# Patient Record
Sex: Male | Born: 1970 | Race: White | Hispanic: No | Marital: Single | Smoking: Current every day smoker
Health system: Southern US, Community
[De-identification: ages and names within clinical notes are randomized; demographics above are authoritative.]

## PROBLEM LIST (undated history)

## (undated) DIAGNOSIS — Z21 Asymptomatic human immunodeficiency virus [HIV] infection status: Secondary | ICD-10-CM

## (undated) DIAGNOSIS — R7989 Other specified abnormal findings of blood chemistry: Secondary | ICD-10-CM

## (undated) DIAGNOSIS — B20 Human immunodeficiency virus [HIV] disease: Secondary | ICD-10-CM

## (undated) DIAGNOSIS — E785 Hyperlipidemia, unspecified: Secondary | ICD-10-CM

## (undated) HISTORY — DX: Human immunodeficiency virus (HIV) disease: B20

## (undated) HISTORY — DX: Other specified abnormal findings of blood chemistry: R79.89

## (undated) HISTORY — PX: APPENDECTOMY: SHX54

## (undated) HISTORY — DX: Asymptomatic human immunodeficiency virus (hiv) infection status: Z21

## (undated) HISTORY — DX: Hyperlipidemia, unspecified: E78.5

---

## 2007-10-26 ENCOUNTER — Emergency Department (HOSPITAL_COMMUNITY): Admission: EM | Admit: 2007-10-26 | Discharge: 2007-10-26 | Payer: Self-pay | Admitting: Emergency Medicine

## 2007-10-29 ENCOUNTER — Emergency Department (HOSPITAL_COMMUNITY): Admission: EM | Admit: 2007-10-29 | Discharge: 2007-10-29 | Payer: Self-pay | Admitting: Emergency Medicine

## 2007-10-31 ENCOUNTER — Encounter: Admission: RE | Admit: 2007-10-31 | Discharge: 2007-10-31 | Payer: Self-pay | Admitting: Infectious Diseases

## 2007-10-31 ENCOUNTER — Ambulatory Visit: Payer: Self-pay | Admitting: Infectious Diseases

## 2007-10-31 DIAGNOSIS — B20 Human immunodeficiency virus [HIV] disease: Secondary | ICD-10-CM | POA: Insufficient documentation

## 2007-10-31 LAB — CONVERTED CEMR LAB
Albumin: 3.8 g/dL (ref 3.5–5.2)
CO2: 28 meq/L (ref 19–32)
Calcium: 8.8 mg/dL (ref 8.4–10.5)
Chlamydia, Swab/Urine, PCR: NEGATIVE
Chloride: 104 meq/L (ref 96–112)
GC Probe Amp, Urine: NEGATIVE
Glucose, Bld: 98 mg/dL (ref 70–99)
HIV 1 RNA Quant: >20000000 copies/mL — ABNORMAL HIGH (ref <50)
Hep A Total Ab: NEGATIVE
Leukocytes, UA: NEGATIVE
Lymphocytes Relative: 20 % (ref 12–46)
Lymphs Abs: 0.9 10*3/uL (ref 0.7–4.0)
Monocytes Relative: 8 % (ref 3–12)
Neutrophils Relative %: 72 % (ref 43–77)
Platelets: 126 10*3/uL — ABNORMAL LOW (ref 150–400)
Potassium: 4.3 meq/L (ref 3.5–5.3)
Protein, ur: NEGATIVE mg/dL
RBC: 4.99 M/uL (ref 4.22–5.81)
Sodium: 139 meq/L (ref 135–145)
Specific Gravity, Urine: 1.024 (ref 1.005–1.03)
Streptococcus, Group A Screen (Direct): NEGATIVE
Total Protein: 5.9 g/dL — ABNORMAL LOW (ref 6.0–8.3)
Urine Glucose: NEGATIVE mg/dL
WBC: 4.5 10*3/uL (ref 4.0–10.5)

## 2007-11-01 ENCOUNTER — Encounter: Payer: Self-pay | Admitting: Infectious Diseases

## 2007-11-01 LAB — CONVERTED CEMR LAB: HIV-1 antibody: NEGATIVE

## 2007-11-06 ENCOUNTER — Emergency Department (HOSPITAL_COMMUNITY): Admission: EM | Admit: 2007-11-06 | Discharge: 2007-11-06 | Payer: Self-pay | Admitting: Emergency Medicine

## 2007-11-08 ENCOUNTER — Telehealth: Payer: Self-pay | Admitting: Internal Medicine

## 2007-11-09 ENCOUNTER — Telehealth: Payer: Self-pay

## 2007-11-09 ENCOUNTER — Ambulatory Visit: Payer: Self-pay | Admitting: Infectious Diseases

## 2007-11-13 ENCOUNTER — Ambulatory Visit: Payer: Self-pay | Admitting: Internal Medicine

## 2007-11-17 ENCOUNTER — Encounter: Payer: Self-pay | Admitting: Infectious Diseases

## 2007-11-20 ENCOUNTER — Telehealth: Payer: Self-pay | Admitting: Infectious Diseases

## 2007-11-21 ENCOUNTER — Encounter: Payer: Self-pay | Admitting: Infectious Diseases

## 2007-11-22 ENCOUNTER — Telehealth: Payer: Self-pay | Admitting: Infectious Disease

## 2007-11-22 DIAGNOSIS — B370 Candidal stomatitis: Secondary | ICD-10-CM

## 2007-11-22 HISTORY — DX: Candidal stomatitis: B37.0

## 2007-11-23 ENCOUNTER — Ambulatory Visit: Payer: Self-pay | Admitting: Infectious Disease

## 2007-11-27 ENCOUNTER — Telehealth: Payer: Self-pay | Admitting: Internal Medicine

## 2007-12-08 ENCOUNTER — Telehealth: Payer: Self-pay | Admitting: Infectious Diseases

## 2007-12-20 ENCOUNTER — Encounter: Payer: Self-pay | Admitting: Infectious Diseases

## 2007-12-26 ENCOUNTER — Ambulatory Visit: Payer: Self-pay | Admitting: Infectious Diseases

## 2008-01-17 ENCOUNTER — Telehealth: Payer: Self-pay | Admitting: Infectious Diseases

## 2008-01-17 ENCOUNTER — Encounter: Payer: Self-pay | Admitting: Infectious Diseases

## 2008-01-18 ENCOUNTER — Encounter: Payer: Self-pay | Admitting: Infectious Diseases

## 2008-03-18 ENCOUNTER — Telehealth: Payer: Self-pay

## 2008-08-14 ENCOUNTER — Encounter: Payer: Self-pay | Admitting: Infectious Diseases

## 2008-11-06 ENCOUNTER — Encounter: Payer: Self-pay | Admitting: Infectious Diseases

## 2009-10-28 IMAGING — CT CT ABDOMEN W/O CM
2 of 4 series · 13 of 32 positions shown, 18 images · non-contrast
Comparison: There are no prior studies for comparison.

CLINICAL DATA: Back pain, nausea.  
 ABDOMEN CT WITHOUT CONTRAST ? 10/26/07:
TECHNIQUE: Multidetector CT imaging of the abdomen was performed following the standard protocol without IV contrast.
TECHNIQUE: Multidetector CT imaging of the pelvis was performed following the standard protocol without IV contrast.

[Series 2: <(id) w/o a/p >(id) · axial · non-contrast · 0.70mm/px · z∈[-467,-167]mm · 5 of 92 slices shown, 10 images]
[im 16/92  soft-tissue]
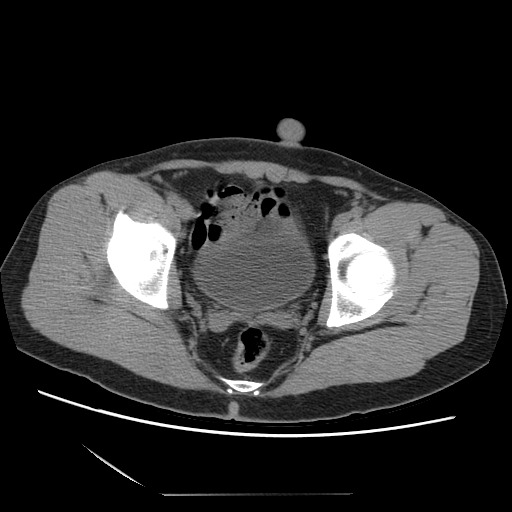
[im 16/92  bone]
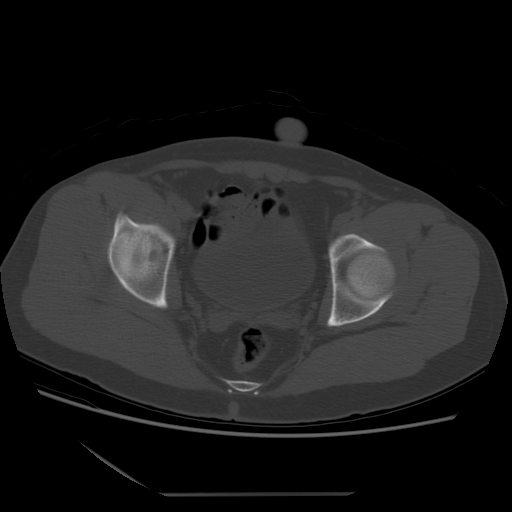
[im 31/92  soft-tissue]
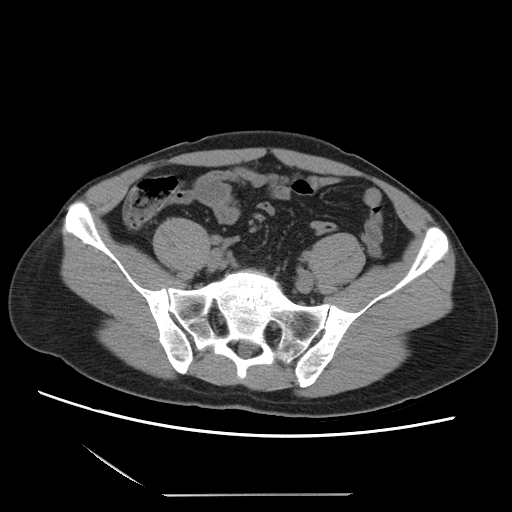
[im 31/92  lung]
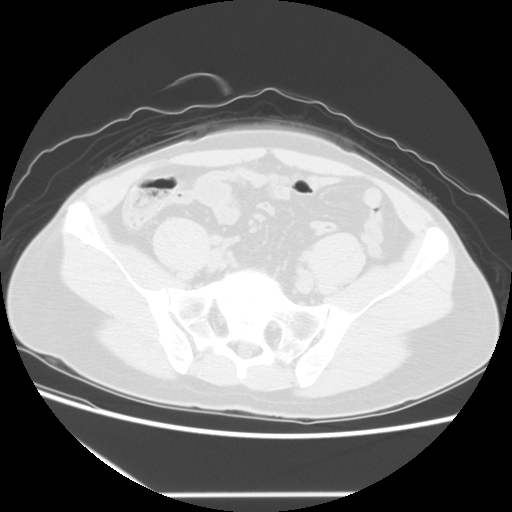
[im 46/92  soft-tissue]
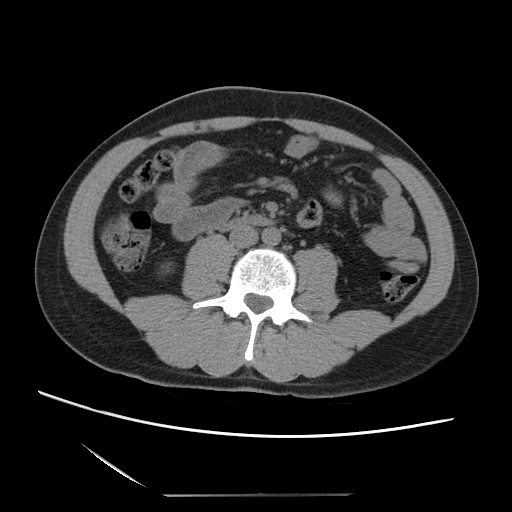
[im 46/92  lung]
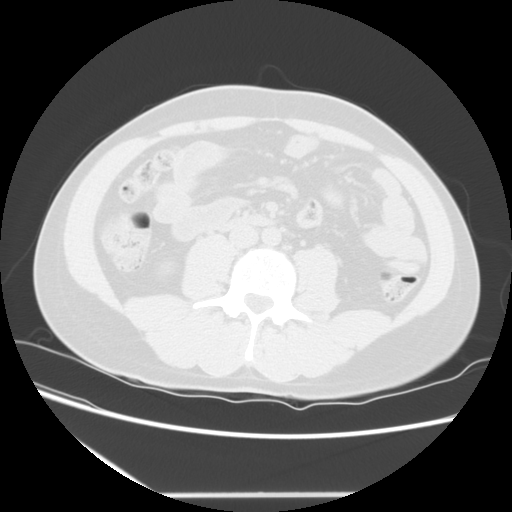
[im 61/92  soft-tissue]
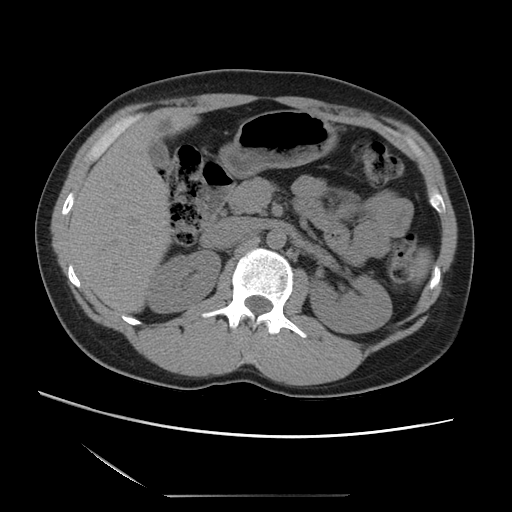
[im 61/92  lung]
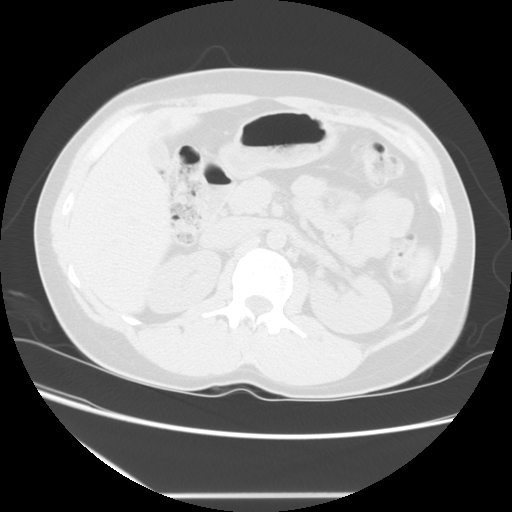
[im 76/92  soft-tissue]
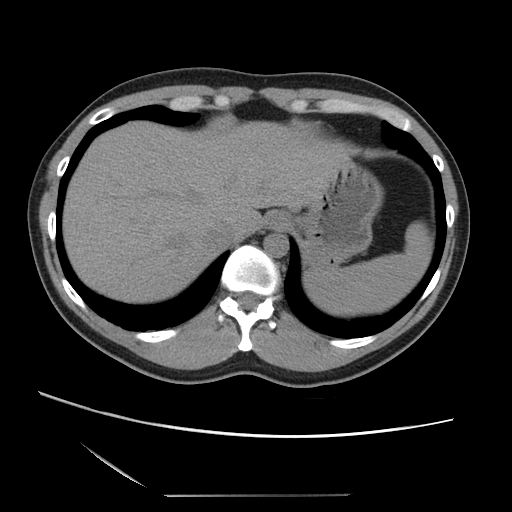
[im 76/92  lung]
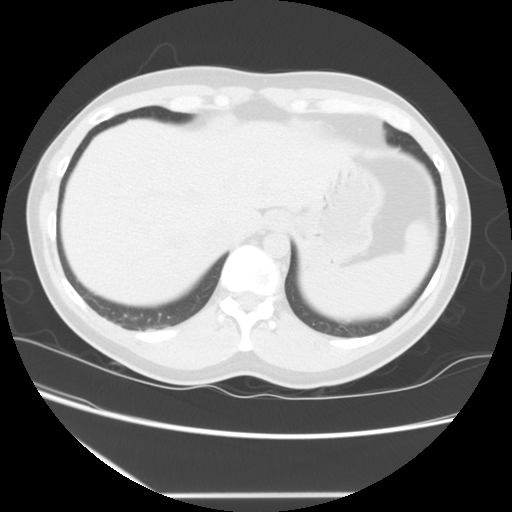

[Series 400: reformatted · sagittal · 0.92mm/px · 8 of 163 slices shown]
[im 14/163  soft-tissue]
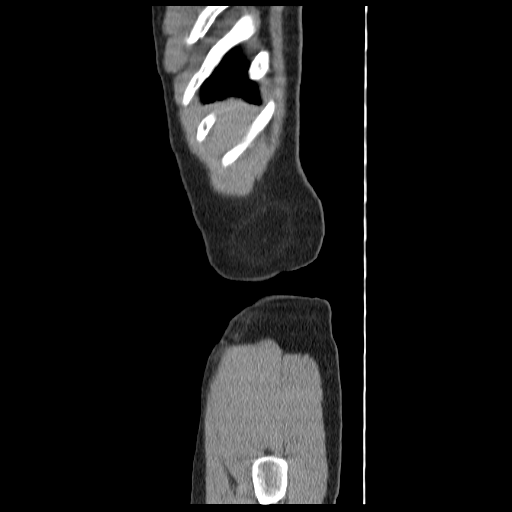
[im 41/163  soft-tissue]
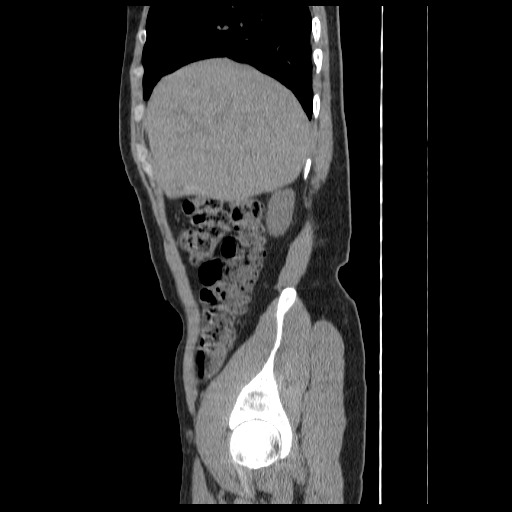
[im 55/163  soft-tissue]
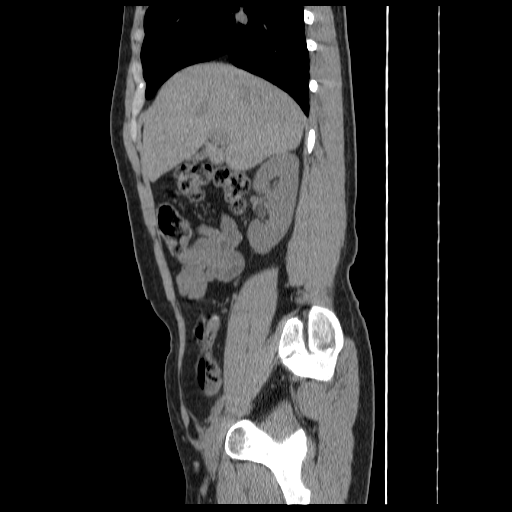
[im 68/163  soft-tissue]
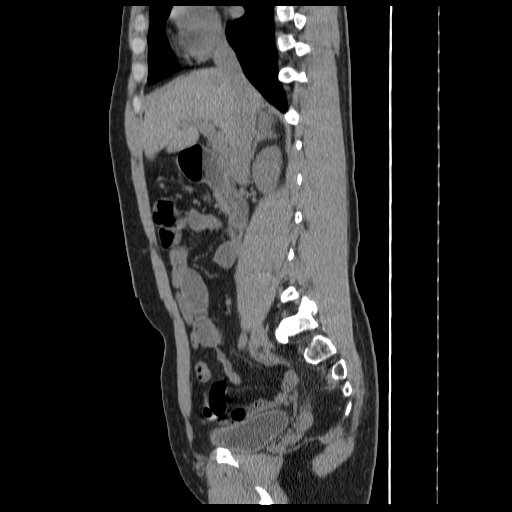
[im 95/163  soft-tissue]
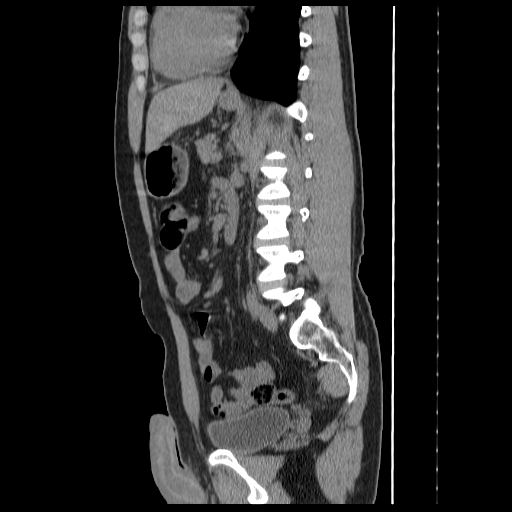
[im 109/163  soft-tissue]
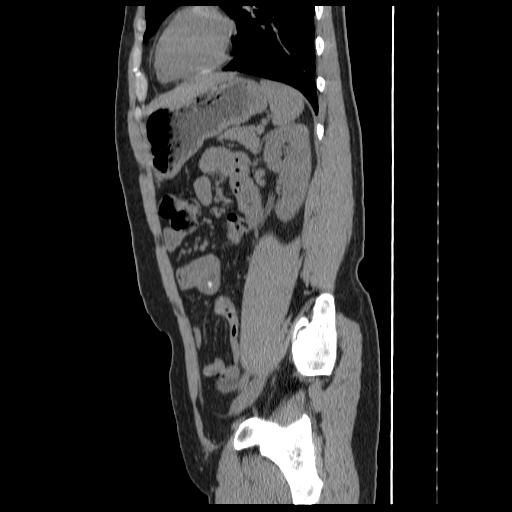
[im 122/163  soft-tissue]
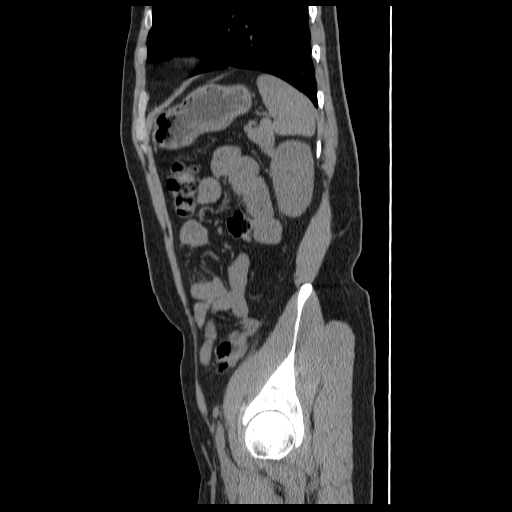
[im 149/163  soft-tissue]
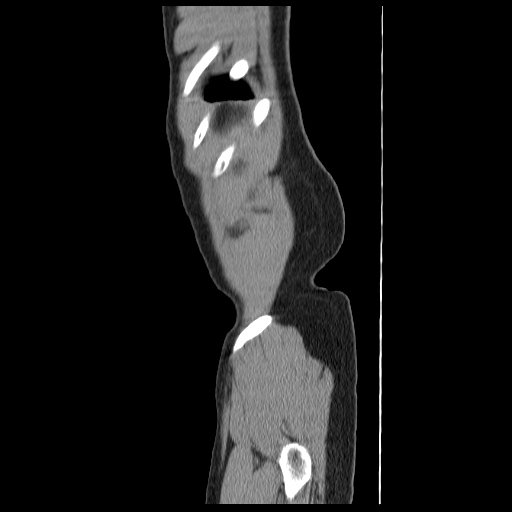

[13 of 32 positions shown; findings below may reference images not displayed]

FINDINGS: Minimal dependent subsegmental atelectasis is noted.  The gallbladder appears mildly contracted.  The noncontrast appearance of the liver, spleen, pancreas and adrenal glands is unremarkable.  No renal calculus or hydronephrosis.  No dilated upper abdominal bowel noted. 
 There is mild levoconvex lumbar scoliosis.
IMPRESSION: 1.  No acute upper abdominal findings. 
 2.  Mild levoconvex lumbar scoliosis.  
 3.  The gallbladder appears mildly contracted. 
 PELVIS CT WITHOUT CONTRAST:
FINDINGS: No distal ureteral calculus noted.   The urinary bladder appears unremarkable.  No free pelvic fluid noted.  No pathologic pelvic adenopathy is evident.
IMPRESSION: No calculus is evident.  No acute pelvic findings.  The appendix is not well seen, but no discrete right lower quadrant inflammatory process is evident.

## 2011-05-10 LAB — CBC
HCT: 39.8
Hemoglobin: 13.8
WBC: 3.2 — ABNORMAL LOW

## 2011-05-10 LAB — URINALYSIS, ROUTINE W REFLEX MICROSCOPIC
Glucose, UA: NEGATIVE
Ketones, ur: NEGATIVE
pH: 6

## 2011-05-10 LAB — DIFFERENTIAL
Basophils Absolute: 0
Basophils Relative: 0
Neutro Abs: 2.6
Neutrophils Relative %: 82 — ABNORMAL HIGH

## 2011-05-10 LAB — COMPREHENSIVE METABOLIC PANEL
Alkaline Phosphatase: 45
BUN: 10
CO2: 24
Chloride: 106
Glucose, Bld: 113 — ABNORMAL HIGH
Potassium: 4.1
Total Bilirubin: 0.6

## 2011-05-10 LAB — T-HELPER CELL (CD4) - (RCID CLINIC ONLY): CD4 % Helper T Cell: 45

## 2011-05-10 LAB — RPR: RPR Ser Ql: NONREACTIVE

## 2011-05-10 LAB — HIV ANTIBODY (ROUTINE TESTING W REFLEX): HIV: REACTIVE — AB

## 2013-07-16 DEATH — deceased

## 2019-02-22 ENCOUNTER — Ambulatory Visit: Payer: Self-pay

## 2019-02-22 ENCOUNTER — Other Ambulatory Visit: Payer: Self-pay

## 2019-02-26 ENCOUNTER — Ambulatory Visit: Payer: 59

## 2019-02-26 ENCOUNTER — Other Ambulatory Visit (HOSPITAL_COMMUNITY)
Admission: RE | Admit: 2019-02-26 | Discharge: 2019-02-26 | Disposition: A | Discharge status: Home / Self Care | Payer: 59 | Source: Ambulatory Visit | Attending: Family | Admitting: Family

## 2019-02-26 ENCOUNTER — Other Ambulatory Visit: Payer: 59

## 2019-02-26 ENCOUNTER — Other Ambulatory Visit: Payer: Self-pay

## 2019-02-26 DIAGNOSIS — B20 Human immunodeficiency virus [HIV] disease: Secondary | ICD-10-CM

## 2019-02-26 DIAGNOSIS — Z113 Encounter for screening for infections with a predominantly sexual mode of transmission: Secondary | ICD-10-CM | POA: Diagnosis present

## 2019-02-27 LAB — T-HELPER CELL (CD4) - (RCID CLINIC ONLY)
CD4 % Helper T Cell: 46 % (ref 33–65)
CD4 T Cell Abs: 1370 /uL (ref 400–1790)

## 2019-02-27 LAB — URINE CYTOLOGY ANCILLARY ONLY
Chlamydia: NEGATIVE
Neisseria Gonorrhea: NEGATIVE

## 2019-02-28 LAB — URINALYSIS
Bilirubin Urine: NEGATIVE
Glucose, UA: NEGATIVE
Hgb urine dipstick: NEGATIVE
Ketones, ur: NEGATIVE
Leukocytes,Ua: NEGATIVE
Nitrite: NEGATIVE
Protein, ur: NEGATIVE
Specific Gravity, Urine: 1.013 (ref 1.001–1.03)
pH: <=5 (ref 5.0–8.0)

## 2019-03-05 ENCOUNTER — Other Ambulatory Visit: Payer: Self-pay

## 2019-03-05 ENCOUNTER — Ambulatory Visit: Payer: Self-pay

## 2019-03-13 LAB — CBC WITH DIFFERENTIAL/PLATELET
Absolute Monocytes: 546 cells/uL (ref 200–950)
Basophils Absolute: 84 cells/uL (ref 0–200)
Basophils Relative: 0.8 %
Eosinophils Absolute: 74 cells/uL (ref 15–500)
Eosinophils Relative: 0.7 %
HCT: 44.8 % (ref 38.5–50.0)
Hemoglobin: 15.6 g/dL (ref 13.2–17.1)
Lymphs Abs: 3140 cells/uL (ref 850–3900)
MCH: 32.4 pg (ref 27.0–33.0)
MCHC: 34.8 g/dL (ref 32.0–36.0)
MCV: 92.9 fL (ref 80.0–100.0)
MPV: 9.6 fL (ref 7.5–12.5)
Monocytes Relative: 5.2 %
Neutro Abs: 6657 cells/uL (ref 1500–7800)
Neutrophils Relative %: 63.4 %
Platelets: 313 10*3/uL (ref 140–400)
RBC: 4.82 10*6/uL (ref 4.20–5.80)
RDW: 13 % (ref 11.0–15.0)
Total Lymphocyte: 29.9 %
WBC: 10.5 10*3/uL (ref 3.8–10.8)

## 2019-03-13 LAB — LIPID PANEL
Cholesterol: 269 mg/dL — ABNORMAL HIGH (ref <200)
HDL: 36 mg/dL — ABNORMAL LOW (ref >=40)
Non-HDL Cholesterol (Calc): 233 mg/dL (calc) — ABNORMAL HIGH (ref <130)
Total CHOL/HDL Ratio: 7.5 (calc) — ABNORMAL HIGH (ref <5.0)
Triglycerides: 410 mg/dL — ABNORMAL HIGH (ref <150)

## 2019-03-13 LAB — HIV-1/2 AB - DIFFERENTIATION
HIV-1 antibody: POSITIVE — AB
HIV-2 Ab: NEGATIVE

## 2019-03-13 LAB — HIV-1 RNA ULTRAQUANT REFLEX TO GENTYP+
HIV 1 RNA Quant: 35 copies/mL — ABNORMAL HIGH
HIV-1 RNA Quant, Log: 1.54 Log copies/mL — ABNORMAL HIGH

## 2019-03-13 LAB — HIV ANTIBODY (ROUTINE TESTING W REFLEX): HIV 1&2 Ab, 4th Generation: REACTIVE — AB

## 2019-03-13 LAB — COMPLETE METABOLIC PANEL WITH GFR
AG Ratio: 1.7 (calc) (ref 1.0–2.5)
ALT: 28 U/L (ref 9–46)
AST: 16 U/L (ref 10–40)
Albumin: 4.7 g/dL (ref 3.6–5.1)
Alkaline phosphatase (APISO): 78 U/L (ref 36–130)
BUN: 11 mg/dL (ref 7–25)
CO2: 24 mmol/L (ref 20–32)
Calcium: 10 mg/dL (ref 8.6–10.3)
Chloride: 103 mmol/L (ref 98–110)
Creat: 1.17 mg/dL (ref 0.60–1.35)
GFR, Est African American: 86 mL/min/{1.73_m2} (ref >=60)
GFR, Est Non African American: 74 mL/min/{1.73_m2} (ref >=60)
Globulin: 2.7 g/dL (calc) (ref 1.9–3.7)
Glucose, Bld: 82 mg/dL (ref 65–99)
Potassium: 4.2 mmol/L (ref 3.5–5.3)
Sodium: 138 mmol/L (ref 135–146)
Total Bilirubin: 0.5 mg/dL (ref 0.2–1.2)
Total Protein: 7.4 g/dL (ref 6.1–8.1)

## 2019-03-13 LAB — HLA B*5701: HLA-B*5701 w/rflx HLA-B High: NEGATIVE

## 2019-03-13 LAB — HEPATITIS C ANTIBODY
Hepatitis C Ab: NONREACTIVE
SIGNAL TO CUT-OFF: 0.01 (ref <1.00)

## 2019-03-13 LAB — HEPATITIS A ANTIBODY, TOTAL: Hepatitis A AB,Total: NONREACTIVE

## 2019-03-13 LAB — QUANTIFERON-TB GOLD PLUS
Mitogen-NIL: 6.55 IU/mL
NIL: 0.02 IU/mL
QuantiFERON-TB Gold Plus: NEGATIVE
TB1-NIL: 0 IU/mL
TB2-NIL: 0 IU/mL

## 2019-03-13 LAB — HEPATITIS B SURFACE ANTIBODY,QUALITATIVE: Hep B S Ab: NONREACTIVE

## 2019-03-13 LAB — HEPATITIS B CORE ANTIBODY, TOTAL: Hep B Core Total Ab: NONREACTIVE

## 2019-03-13 LAB — RPR: RPR Ser Ql: NONREACTIVE

## 2019-03-13 LAB — HEPATITIS B SURFACE ANTIGEN: Hepatitis B Surface Ag: NONREACTIVE

## 2019-03-15 ENCOUNTER — Ambulatory Visit: Payer: Self-pay | Admitting: Pharmacist

## 2019-03-15 ENCOUNTER — Encounter: Payer: Self-pay | Admitting: Infectious Diseases

## 2019-03-22 ENCOUNTER — Encounter: Payer: Self-pay | Admitting: Family

## 2019-03-26 ENCOUNTER — Other Ambulatory Visit: Payer: Self-pay

## 2019-03-26 ENCOUNTER — Other Ambulatory Visit: Payer: Self-pay | Admitting: Family

## 2019-03-26 ENCOUNTER — Telehealth: Payer: Self-pay | Admitting: Pharmacy Technician

## 2019-03-26 ENCOUNTER — Ambulatory Visit (INDEPENDENT_AMBULATORY_CARE_PROVIDER_SITE_OTHER): Payer: 59 | Admitting: Pharmacist

## 2019-03-26 ENCOUNTER — Encounter: Payer: Self-pay | Admitting: Family

## 2019-03-26 ENCOUNTER — Ambulatory Visit (INDEPENDENT_AMBULATORY_CARE_PROVIDER_SITE_OTHER): Payer: 59 | Admitting: Family

## 2019-03-26 VITALS — BP 125/72 | HR 67 | Temp 98.0°F

## 2019-03-26 DIAGNOSIS — B20 Human immunodeficiency virus [HIV] disease: Secondary | ICD-10-CM

## 2019-03-26 DIAGNOSIS — Z0000 Encounter for general adult medical examination without abnormal findings: Secondary | ICD-10-CM

## 2019-03-26 DIAGNOSIS — E782 Mixed hyperlipidemia: Secondary | ICD-10-CM | POA: Diagnosis not present

## 2019-03-26 DIAGNOSIS — Z113 Encounter for screening for infections with a predominantly sexual mode of transmission: Secondary | ICD-10-CM | POA: Diagnosis not present

## 2019-03-26 MED ORDER — TRIUMEQ 600-50-300 MG PO TABS: 1.0000 | ORAL_TABLET | Freq: Every day | ORAL | 5 refills | Status: DC

## 2019-03-26 NOTE — Assessment & Plan Note (Signed)
   Declines immunizations today - due for Menveo and Pneumovax.  Discussed importance of safe sexual practice to reduce risk of acquisition/transmission of STI. Declines condoms.   Dental visit completed within the last 3 months.

## 2019-03-26 NOTE — Assessment & Plan Note (Signed)
Mr. Nunziato has well-controlled HIV disease that was initially diagnosed in 2009 with CD4 nadir of 260 with risk factor for acquiring HIV being MSM. He has no signs/symptoms of opportunistic infection or progressive HIV disease. Discussed clinic orientation. He met with our pharmacy staff today. Plan to continue current dose of Triumeq. He has no problems obtaining his medication from the pharmacy. Plan for follow up in 3 months of sooner if needed with lab work 1-2 weeks prior to office visit.

## 2019-03-26 NOTE — Telephone Encounter (Signed)
[facility] Patient Teacher, English as a foreign language completed.    The patient is insured through [employer] and has a $100 copay.  We will continue to follow to see if copay assistance is needed.  Lawrence Lozano. Nadara Mustard [place] Patient [facility] for Infectious Disease Phone: [phone] Fax:  [phone]

## 2019-03-26 NOTE — Progress Notes (Signed)
Subjective:    Patient ID: Lawrence Lozano, male    DOB: [birth year], 48 y.o.   MRN: [id number]  Chief Complaint  Patient presents with  . HIV Positive/AIDS    HPI:  Lawrence Lozano is a 48 y.o. male with previous medical history of HIV diagnosed in 2009, mixed hyperlipidemia, low testosterone, and depression who presents today to transfer care from his previous provider Dr. Asa Saunas in [place], [place].  Lawrence Lozano completed his initial clinic blood work on 02/26/2019 with a viral load of 35 and CD4 count of 1370. [id number] and QuantiFERON gold were negative.  He was negative for gonorrhea, chlamydia, and syphilis.  He has no active infection with hepatitis B or C and is not serologically immune to hepatitis B or hepatitis A.  Kidney function, liver function, electrolytes within normal ranges.  Cholesterol significantly elevated with triglycerides of 410, HDL 36, and LDL unable to be calculated due to increased triglycerides.  Lawrence Lozano was initially diagnosed with HIV disease in 2009 with a CD4 nadir of 260 when he experienced acute onset illness.  Risk factor for acquiring HIV include MSM.  He was started on a Atripla shortly thereafter and changed to Triumeq in 2018.  He has remained on Triumeq since that time with no adverse side effects.  He continues to take Triumeq today with no recent missed doses.  Currently covered through [place] healthcare and has no problems obtaining his medication from the pharmacy.  He returns to [place] from [employer] for work purposes originally living in [place].  He denies any feelings of being down, depressed, or hopeless.  No recreational or illicit drug use, tobacco use, and occasional alcohol consumption.  He is not currently sexually active and declines condoms.   No Known Allergies    Outpatient Medications Prior to Visit  Medication Sig Dispense Refill  . abacavir-dolutegravir-lamiVUDine (TRIUMEQ) 600-50-300 MG tablet Take 1 tablet  by mouth daily.     No facility-administered medications prior to visit.      Past Medical History:  Diagnosis Date  . HIV infection ([place])   . Hyperlipidemia   . Low testosterone       Past Surgical History:  Procedure Laterality Date  . APPENDECTOMY        Family History  Problem Relation Age of Onset  . Depression Mother   . Cancer Father   . Diabetes Father       Social History   Socioeconomic History  . Marital status: Single    Spouse name: Not on file  . Number of children: 0  . Years of education: 66  . Highest education level: Not on file  Occupational History  . Not on file  Social Needs  . Financial resource strain: Not on file  . Food insecurity    Worry: Not on file    Inability: Not on file  . Transportation needs    Medical: Not on file    Non-medical: Not on file  Tobacco Use  . Smoking status: Current Every Day Smoker    Packs/day: 1.00    Years: 1.00    Pack years: 1.00    Types: Cigarettes  . Smokeless tobacco: Never Used  Substance and Sexual Activity  . Alcohol use: Not Currently  . Drug use: Not Currently  . Sexual activity: Not on file  Lifestyle  . Physical activity    Days per week: Not on file    Minutes per session: Not on  file  . Stress: Not on file  Relationships  . Social Herbalist on phone: Not on file    Gets together: Not on file    Attends religious service: Not on file    Active member of club or organization: Not on file    Attends meetings of clubs or organizations: Not on file    Relationship status: Not on file  . Intimate partner violence    Fear of current or ex partner: Not on file    Emotionally abused: Not on file    Physically abused: Not on file    Forced sexual activity: Not on file  Other Topics Concern  . Not on file  Social History Narrative  . Not on file      Review of Systems  Constitutional: Negative for appetite change, chills, fatigue, fever and unexpected weight  change.  Eyes: Negative for visual disturbance.  Respiratory: Negative for cough, chest tightness, shortness of breath and wheezing.   Cardiovascular: Negative for chest pain and leg swelling.  Gastrointestinal: Negative for abdominal pain, constipation, diarrhea, nausea and vomiting.  Genitourinary: Negative for dysuria, flank pain, frequency, genital sores, hematuria and urgency.  Skin: Negative for rash.  Allergic/Immunologic: Negative for immunocompromised state.  Neurological: Negative for dizziness and headaches.       Objective:    BP 125/72   Pulse 67   Temp 98 F (36.7 C) (Oral)  Nursing note and vital signs reviewed.  Physical Exam Constitutional:      General: He is not in acute distress.    Appearance: Normal appearance. He is well-developed.     Comments: Seated in the chair; pleasant.   Eyes:     Conjunctiva/sclera: Conjunctivae normal.  Neck:     Musculoskeletal: Neck supple.  Cardiovascular:     Rate and Rhythm: Normal rate and regular rhythm.     Heart sounds: Normal heart sounds. No murmur. No friction rub. No gallop.   Pulmonary:     Effort: Pulmonary effort is normal. No respiratory distress.     Breath sounds: Normal breath sounds. No wheezing or rales.  Chest:     Chest wall: No tenderness.  Abdominal:     General: Bowel sounds are normal.     Palpations: Abdomen is soft.     Tenderness: There is no abdominal tenderness.  Lymphadenopathy:     Cervical: No cervical adenopathy.  Skin:    General: Skin is warm and dry.     Findings: No rash.  Neurological:     Mental Status: He is alert and oriented to person, place, and time.  Psychiatric:        Behavior: Behavior normal.        Thought Content: Thought content normal.        Judgment: Judgment normal.         Assessment & Plan:   Problem List Items Addressed This Visit      Other   Human immunodeficiency virus (HIV) disease ([place]) - Primary    Lawrence Lozano has well-controlled HIV  disease that was initially diagnosed in 2009 with CD4 nadir of 260 with risk factor for acquiring HIV being MSM. He has no signs/symptoms of opportunistic infection or progressive HIV disease. Discussed clinic orientation. He met with our pharmacy staff today. Plan to continue current dose of Triumeq. He has no problems obtaining his medication from the pharmacy. Plan for follow up in 3 months of sooner if needed with lab  work 1-2 weeks prior to office visit.       Relevant Medications   abacavir-dolutegravir-lamiVUDine (TRIUMEQ) 600-50-300 MG tablet   Other Relevant Orders   T-helper cell (CD4)- ([facility] clinic only)   HIV-1 RNA quant-no reflex-bld   CBC   Comprehensive metabolic panel   Healthcare maintenance     Declines immunizations today - due for Menveo and Pneumovax.  Discussed importance of safe sexual practice to reduce risk of acquisition/transmission of STI. Declines condoms.   Dental visit completed within the last 3 months.       Mixed hyperlipidemia    Mr. Ripberger has elevated triglycerides and decreased HDL levels concerning for underlying insulin resistance. Not currently on medication and previously on rosuvastatin. He is unaware if family has history of hyperlipidemia. Encouraged to decrease processed/sugary foods and carbohydrates in combination with fatty acids. If no improvement may need to restart medication shortly to reduce risk of cardiovascular disease.       Relevant Orders   Lipid panel    Other Visit Diagnoses    Screening for STDs (sexually transmitted diseases)       Relevant Orders   RPR       I am having [place] C. Kithcart maintain his Triumeq.   Meds ordered this encounter  Medications  . abacavir-dolutegravir-lamiVUDine (TRIUMEQ) 600-50-300 MG tablet    Sig: Take 1 tablet by mouth daily.    Dispense:  30 tablet    Refill:  5    Order Specific Question:   Supervising Provider    Answer:   Carlyle Basques [[id number]]     Follow-up: Return in  about 3 months (around 06/26/2019), or if symptoms worsen or fail to improve.    Terri Piedra, MSN, FNP-C Nurse Practitioner [facility] for Infectious Disease [place] Office phone: [phone] Pager: [phone] [facility] Main number: [phone] jnm f

## 2019-03-26 NOTE — Progress Notes (Signed)
Met with patient today and introduced myself and our pharmacy services.  He had no questions or issues with his Triumeq.  Gave him my card and told him to call me with any issues/questions/concerns in the future.

## 2019-03-26 NOTE — Patient Instructions (Signed)
Nice to meet you.  We will continue with your Triumeq.   Refills have been sent to the pharmacy.   Plan for follow up in 3 months or sooner if needed with labs 1-2 weeks prior to your appointment.   Recommend getting your flu shot in September when available.  Have a great day and stay safe!

## 2019-03-26 NOTE — Assessment & Plan Note (Signed)
Lawrence Lozano has elevated triglycerides and decreased HDL levels concerning for underlying insulin resistance. Not currently on medication and previously on rosuvastatin. He is unaware if family has history of hyperlipidemia. Encouraged to decrease processed/sugary foods and carbohydrates in combination with fatty acids. If no improvement may need to restart medication shortly to reduce risk of cardiovascular disease.

## 2019-06-05 ENCOUNTER — Other Ambulatory Visit: Payer: 59

## 2019-06-05 ENCOUNTER — Other Ambulatory Visit: Payer: Self-pay

## 2019-06-05 DIAGNOSIS — Z113 Encounter for screening for infections with a predominantly sexual mode of transmission: Secondary | ICD-10-CM

## 2019-06-05 DIAGNOSIS — E782 Mixed hyperlipidemia: Secondary | ICD-10-CM

## 2019-06-05 DIAGNOSIS — B20 Human immunodeficiency virus [HIV] disease: Secondary | ICD-10-CM

## 2019-06-06 LAB — T-HELPER CELL (CD4) - (RCID CLINIC ONLY)
CD4 % Helper T Cell: 47 % (ref 33–65)
CD4 T Cell Abs: 1659 /uL (ref 400–1790)

## 2019-06-08 LAB — COMPREHENSIVE METABOLIC PANEL
AG Ratio: 1.8 (calc) (ref 1.0–2.5)
ALT: 25 U/L (ref 9–46)
AST: 15 U/L (ref 10–40)
Albumin: 4.4 g/dL (ref 3.6–5.1)
Alkaline phosphatase (APISO): 79 U/L (ref 36–130)
BUN: 15 mg/dL (ref 7–25)
CO2: 24 mmol/L (ref 20–32)
Calcium: 9.3 mg/dL (ref 8.6–10.3)
Chloride: 104 mmol/L (ref 98–110)
Creat: 1.29 mg/dL (ref 0.60–1.35)
Globulin: 2.4 g/dL (calc) (ref 1.9–3.7)
Glucose, Bld: 90 mg/dL (ref 65–99)
Potassium: 4.3 mmol/L (ref 3.5–5.3)
Sodium: 139 mmol/L (ref 135–146)
Total Bilirubin: 0.3 mg/dL (ref 0.2–1.2)
Total Protein: 6.8 g/dL (ref 6.1–8.1)

## 2019-06-08 LAB — CBC
HCT: 44.7 % (ref 38.5–50.0)
Hemoglobin: 15.2 g/dL (ref 13.2–17.1)
MCH: 31.9 pg (ref 27.0–33.0)
MCHC: 34 g/dL (ref 32.0–36.0)
MCV: 93.9 fL (ref 80.0–100.0)
MPV: 9.9 fL (ref 7.5–12.5)
Platelets: 321 10*3/uL (ref 140–400)
RBC: 4.76 10*6/uL (ref 4.20–5.80)
RDW: 13 % (ref 11.0–15.0)
WBC: 11 10*3/uL — ABNORMAL HIGH (ref 3.8–10.8)

## 2019-06-08 LAB — LIPID PANEL
Cholesterol: 271 mg/dL — ABNORMAL HIGH (ref <200)
HDL: 35 mg/dL — ABNORMAL LOW (ref >=40)
LDL Cholesterol (Calc): 169 mg/dL (calc) — ABNORMAL HIGH
Non-HDL Cholesterol (Calc): 236 mg/dL (calc) — ABNORMAL HIGH (ref <130)
Total CHOL/HDL Ratio: 7.7 (calc) — ABNORMAL HIGH (ref <5.0)
Triglycerides: 393 mg/dL — ABNORMAL HIGH (ref <150)

## 2019-06-08 LAB — HIV-1 RNA QUANT-NO REFLEX-BLD
HIV 1 RNA Quant: <20 copies/mL
HIV-1 RNA Quant, Log: <1.3 Log copies/mL

## 2019-06-08 LAB — RPR: RPR Ser Ql: NONREACTIVE

## 2019-06-19 ENCOUNTER — Ambulatory Visit (INDEPENDENT_AMBULATORY_CARE_PROVIDER_SITE_OTHER): Payer: 59 | Admitting: Family

## 2019-06-19 ENCOUNTER — Other Ambulatory Visit: Payer: Self-pay

## 2019-06-19 ENCOUNTER — Encounter: Payer: Self-pay | Admitting: Family

## 2019-06-19 VITALS — BP 138/77 | HR 96 | Temp 97.6°F | Wt 183.0 lb

## 2019-06-19 DIAGNOSIS — J3089 Other allergic rhinitis: Secondary | ICD-10-CM

## 2019-06-19 DIAGNOSIS — B20 Human immunodeficiency virus [HIV] disease: Secondary | ICD-10-CM | POA: Diagnosis not present

## 2019-06-19 DIAGNOSIS — L918 Other hypertrophic disorders of the skin: Secondary | ICD-10-CM

## 2019-06-19 DIAGNOSIS — Z0000 Encounter for general adult medical examination without abnormal findings: Secondary | ICD-10-CM | POA: Diagnosis not present

## 2019-06-19 DIAGNOSIS — E782 Mixed hyperlipidemia: Secondary | ICD-10-CM

## 2019-06-19 DIAGNOSIS — J309 Allergic rhinitis, unspecified: Secondary | ICD-10-CM | POA: Insufficient documentation

## 2019-06-19 DIAGNOSIS — Z720 Tobacco use: Secondary | ICD-10-CM | POA: Diagnosis not present

## 2019-06-19 MED ORDER — VARENICLINE TARTRATE 1 MG PO TABS: 1.0000 mg | ORAL_TABLET | Freq: Two times a day (BID) | ORAL | 2 refills | Status: DC

## 2019-06-19 MED ORDER — CHANTIX STARTING MONTH PAK 0.5 MG X 11 & 1 MG X 42 PO TABS: ORAL_TABLET | ORAL | 0 refills | Status: DC

## 2019-06-19 MED ORDER — TRIUMEQ 600-50-300 MG PO TABS: 1.0000 | ORAL_TABLET | Freq: Every day | ORAL | 5 refills | Status: DC

## 2019-06-19 NOTE — Assessment & Plan Note (Signed)
Lawrence Lozano continues to smoke approximately 1 pack/day and is interested in quitting smoking.  He is in the preparation/determination stage and requesting Chantix.  Reviewed medication usage and side effects.  He has used this medication before with some success.  Continue to monitor.

## 2019-06-19 NOTE — Patient Instructions (Signed)
Nice to see you.  Continue to take your Triumeq as prescribed  A prescription for Chantix has been sent to the pharmacy to aid in quitting smoking.  A referral to dermatology will be placed for your skin tag removal.   Plan for follow up in 4 months or sooner if needed with lab work 1-2 weeks prior to appointment.   General Recommendations:    Please drink plenty of fluids.  Get plenty of rest   Sleep in humidified air  Use saline nasal sprays  Netti pot   OTC Medications:  Decongestants - helps relieve congestion   Flonase (generic fluticasone) or Nasacort (generic triamcinolone) - please make sure to use the "cross-over" technique at a 45 degree angle towards the opposite eye as opposed to straight up the nasal passageway.   Sudafed (generic pseudoephedrine - Note this is the one that is available behind the pharmacy counter); Products with phenylephrine (-PE) may also be used but is often not as effective as pseudoephedrine.   If you have HIGH BLOOD PRESSURE - Coricidin HBP; AVOID any product that is -D as this contains pseudoephedrine which may increase your blood pressure.  Afrin (oxymetazoline) every 6-8 hours for up to 3 days.   Allergies - helps relieve runny nose, itchy eyes and sneezing   Claritin (generic loratidine), Allegra (fexofenidine), or Zyrtec (generic cyrterizine) for runny nose. These medications should not cause drowsiness.  Note - Benadryl (generic diphenhydramine) may be used however may cause drowsiness  Cough -   Delsym or Robitussin (generic dextromethorphan)  Expectorants - helps loosen mucus to ease removal   Mucinex (generic guaifenesin) as directed on the package.  Headaches / General Aches   Tylenol (generic acetaminophen) - DO NOT EXCEED 3 grams (3,000 mg) in a 24 hour time period  Advil/Motrin (generic ibuprofen)   Sore Throat -   Salt water gargle   Chloraseptic (generic benzocaine) spray or lozenges / Sucrets  (generic dyclonine)

## 2019-06-19 NOTE — Assessment & Plan Note (Signed)
Mr. Hehn has a skin tag located on his posterior neck.  Referral to dermatology placed for further evaluation and removal as indicated.

## 2019-06-19 NOTE — Progress Notes (Signed)
Subjective:    Patient ID: Lawrence Lozano, male    DOB: 18-Feb-1971, 48 y.o.   MRN: [id number]  Chief Complaint  Patient presents with  . HIV Positive/AIDS     HPI:  Lawrence Lozano is a 48 y.o. male with HIV disease and mixed hyperlipidemia who was last seen in the office on 03/25/2021 transfer/establish care with good adherence and tolerance to his ART regimen of Triumeq.  Viral load at the time was undetectable with CD4 count of 1370.  Most recent blood work completed on 06/05/2019 with viral load that remains undetectable and CD4 count of 1659.  RPR nonreactive for syphilis.  Kidney function, liver function, and electrolytes within normal ranges.  Continues to have hypertriglyceridemia at 393 with low HDL of 35.  LDL was noted to be 169.  Healthcare maintenance due includes Menveo and influenza vaccinations.  Lawrence Lozano continues to take his Triumeq as prescribed with no adverse side effects or missed doses since his last office visit.  Having allergy symptoms including itchy and watering eyes, sneezing, rhinorrhea, and postnasal drip.   He also has a skin tag located on the back of his neck that he would like to have removed.  Denies fevers, chills, night sweats, headaches, changes in vision, neck pain/stiffness, nausea, diarrhea, vomiting, lesions or rashes.  Lawrence Lozano remains covered through [place] healthcare and has no problems obtaining his medication from the pharmacy.  Denies any feelings of being down, depressed, or hopeless recently.  He continues to smoke approximately 1 pack of cigarettes per day on average and is interested in quitting and would like Chantix to help him.  He has used it in the past with good success.  No recreational or illicit drug use or alcohol consumption at present.  Not currently sexually active.   No Known Allergies    Outpatient Medications Prior to Visit  Medication Sig Dispense Refill  . abacavir-dolutegravir-lamiVUDine (TRIUMEQ) 600-50-300 MG  tablet Take 1 tablet by mouth daily. 30 tablet 5   No facility-administered medications prior to visit.      Past Medical History:  Diagnosis Date  . HIV infection ([facility])   . Hyperlipidemia   . Low testosterone   . THRUSH 11/22/2007   Qualifier: Diagnosis of  By: Ileene MusaEstridge RN, Denise       Past Surgical History:  Procedure Laterality Date  . APPENDECTOMY         Review of Systems  Constitutional: Negative for appetite change, chills, fatigue, fever and unexpected weight change.  HENT: Positive for postnasal drip, rhinorrhea and sneezing.   Eyes: Positive for itching. Negative for visual disturbance.  Respiratory: Negative for cough, chest tightness, shortness of breath and wheezing.   Cardiovascular: Negative for chest pain and leg swelling.  Gastrointestinal: Negative for abdominal pain, constipation, diarrhea, nausea and vomiting.  Genitourinary: Negative for dysuria, flank pain, frequency, genital sores, hematuria and urgency.  Skin: Negative for rash.       Positive for skin tag  Allergic/Immunologic: Negative for immunocompromised state.  Neurological: Negative for dizziness and headaches.      Objective:    BP 138/77   Pulse 96   Temp 97.6 F (36.4 C)   Wt 183 lb (83 kg)  Nursing note and vital signs reviewed.  Physical Exam Constitutional:      General: He is not in acute distress.    Appearance: He is well-developed.  HENT:     Right Ear: Tympanic membrane and ear canal normal.  Left Ear: Tympanic membrane and ear canal normal.     Nose: Rhinorrhea present.     Mouth/Throat:     Mouth: Mucous membranes are moist.     Pharynx: No oropharyngeal exudate or posterior oropharyngeal erythema.  Eyes:     Conjunctiva/sclera: Conjunctivae normal.  Neck:     Musculoskeletal: Neck supple.     Comments: Small pencil eraser sized skin tag located on the posterior neck.  Nontender with no induration. Cardiovascular:     Rate and Rhythm: Normal rate and regular  rhythm.     Heart sounds: Normal heart sounds. No murmur. No friction rub. No gallop.   Pulmonary:     Effort: Pulmonary effort is normal. No respiratory distress.     Breath sounds: Normal breath sounds. No wheezing or rales.  Chest:     Chest wall: No tenderness.  Abdominal:     General: Bowel sounds are normal.     Palpations: Abdomen is soft.     Tenderness: There is no abdominal tenderness.  Lymphadenopathy:     Cervical: No cervical adenopathy.  Skin:    General: Skin is warm and dry.     Findings: No rash.  Neurological:     Mental Status: He is alert and oriented to person, place, and time.  Psychiatric:        Behavior: Behavior normal.        Thought Content: Thought content normal.        Judgment: Judgment normal.      Depression screen [facility] 2/9 06/19/2019 03/26/2019  Decreased Interest 0 0  Down, Depressed, Hopeless 0 0  PHQ - 2 Score 0 0       Assessment & Plan:    Patient Active Problem List   Diagnosis Date Noted  . Allergic rhinitis 06/19/2019  . Skin tag 06/19/2019  . Tobacco use 06/19/2019  . Healthcare maintenance 03/26/2019  . Mixed hyperlipidemia 03/26/2019  . Human immunodeficiency virus (HIV) disease ([place]) 10/31/2007     Problem List Items Addressed This Visit      Respiratory   Allergic rhinitis    Lawrence Lozano has signs and symptoms that are consistent with allergic rhinitis most likely associated with change of weather recently.  Recommend over-the-counter medications as needed for symptom relief and supportive care including antihistamines.  Medication recommendations provided in after visit summary.  Follow-up if symptoms worsen or do not improve.        Musculoskeletal and Integument   Skin tag    Lawrence Lozano has a skin tag located on his posterior neck.  Referral to dermatology placed for further evaluation and removal as indicated.      Relevant Orders   Ambulatory referral to Dermatology     Other   Human immunodeficiency virus  (HIV) disease ([place]) - Primary    Lawrence Lozano continues to have well-controlled HIV disease with good adherence and tolerance to his ART regimen of Triumeq.  He has no signs/symptoms of opportunistic infection or progressive HIV disease.  He has no problems obtaining his medication from the pharmacy.  We reviewed his lab work and discussed the plan of care with questions answered.  Continue current dose of Triumeq.  Plan for follow-up in 4 months or sooner if needed with lab work 1 to 2 weeks prior to appointment.      Relevant Medications   abacavir-dolutegravir-lamiVUDine (TRIUMEQ) 600-50-300 MG tablet   Other Relevant Orders   HIV-1 RNA quant-no reflex-bld   CBC  T-helper cell (CD4)- ([facility] clinic only)   Comprehensive metabolic panel   Healthcare maintenance     Declines immunizations today.  Discussed importance of safe sexual practice to reduce risk of acquisition/transmission of STI.  Declines condoms.      Mixed hyperlipidemia    Lawrence Lozano continues to have hypertriglyceridemia and lower HDL levels concerning for developing insulin resistance and metabolic syndrome.  Encouraged to decrease processed/sugary foods and excessive carbohydrate intake.  May need to consider medication to reduce triglycerides while working on nutritional changes.      Tobacco use    Lawrence Lozano continues to smoke approximately 1 pack/day and is interested in quitting smoking.  He is in the preparation/determination stage and requesting Chantix.  Reviewed medication usage and side effects.  He has used this medication before with some success.  Continue to monitor.      Relevant Medications   varenicline (CHANTIX STARTING MONTH PAK) 0.5 MG X 11 & 1 MG X 42 tablet   varenicline (CHANTIX CONTINUING MONTH PAK) 1 MG tablet       I am having Jakim C. Mangione start on Chantix Starting Month Pak and varenicline. I am also having him maintain his Triumeq.   Meds ordered this encounter  Medications  .  varenicline (CHANTIX STARTING MONTH PAK) 0.5 MG X 11 & 1 MG X 42 tablet    Sig: Take one 0.5 mg tablet by mouth once daily for 3 days, then increase to one 0.5 mg tablet twice daily for 4 days, then increase to one 1 mg tablet twice daily.    Dispense:  53 tablet    Refill:  0    Order Specific Question:   Supervising Provider    Answer:   Judyann Munson [[id number]]  . varenicline (CHANTIX CONTINUING MONTH PAK) 1 MG tablet    Sig: Take 1 tablet (1 mg total) by mouth 2 (two) times daily.    Dispense:  60 tablet    Refill:  2    Order Specific Question:   Supervising Provider    Answer:   Judyann Munson [[id number]]  . abacavir-dolutegravir-lamiVUDine (TRIUMEQ) 600-50-300 MG tablet    Sig: Take 1 tablet by mouth daily.    Dispense:  30 tablet    Refill:  5    Order Specific Question:   Supervising Provider    Answer:   Judyann Munson [[id number]]     Follow-up: Return in about 4 months (around 10/17/2019), or if symptoms worsen or fail to improve.   Marcos Eke, MSN, FNP-C Nurse Practitioner [facility] for Infectious Disease [facility] Main number: [phone]

## 2019-06-19 NOTE — Assessment & Plan Note (Signed)
   Declines immunizations today.  Discussed importance of safe sexual practice to reduce risk of acquisition/transmission of STI. Declines condoms.  

## 2019-06-19 NOTE — Assessment & Plan Note (Signed)
Mr. Scioneaux has signs and symptoms that are consistent with allergic rhinitis most likely associated with change of weather recently.  Recommend over-the-counter medications as needed for symptom relief and supportive care including antihistamines.  Medication recommendations provided in after visit summary.  Follow-up if symptoms worsen or do not improve.

## 2019-06-19 NOTE — Assessment & Plan Note (Signed)
Lawrence Lozano continues to have hypertriglyceridemia and lower HDL levels concerning for developing insulin resistance and metabolic syndrome.  Encouraged to decrease processed/sugary foods and excessive carbohydrate intake.  May need to consider medication to reduce triglycerides while working on nutritional changes.

## 2019-06-19 NOTE — Assessment & Plan Note (Signed)
Mr. Okray continues to have well-controlled HIV disease with good adherence and tolerance to his ART regimen of Triumeq.  He has no signs/symptoms of opportunistic infection or progressive HIV disease.  He has no problems obtaining his medication from the pharmacy.  We reviewed his lab work and discussed the plan of care with questions answered.  Continue current dose of Triumeq.  Plan for follow-up in 4 months or sooner if needed with lab work 1 to 2 weeks prior to appointment.

## 2019-10-03 ENCOUNTER — Other Ambulatory Visit: Payer: Self-pay

## 2019-10-03 ENCOUNTER — Other Ambulatory Visit: Payer: 59

## 2019-10-03 DIAGNOSIS — B20 Human immunodeficiency virus [HIV] disease: Secondary | ICD-10-CM

## 2019-10-04 LAB — T-HELPER CELL (CD4) - (RCID CLINIC ONLY)
CD4 % Helper T Cell: 50 % (ref 33–65)
CD4 T Cell Abs: 1567 /uL (ref 400–1790)

## 2019-10-06 LAB — CBC
HCT: 44.3 % (ref 38.5–50.0)
Hemoglobin: 15.4 g/dL (ref 13.2–17.1)
MCH: 32.2 pg (ref 27.0–33.0)
MCHC: 34.8 g/dL (ref 32.0–36.0)
MCV: 92.5 fL (ref 80.0–100.0)
MPV: 10.1 fL (ref 7.5–12.5)
Platelets: 285 10*3/uL (ref 140–400)
RBC: 4.79 10*6/uL (ref 4.20–5.80)
RDW: 12.8 % (ref 11.0–15.0)
WBC: 8.2 10*3/uL (ref 3.8–10.8)

## 2019-10-06 LAB — COMPREHENSIVE METABOLIC PANEL
AG Ratio: 2.2 (calc) (ref 1.0–2.5)
ALT: 28 U/L (ref 9–46)
AST: 15 U/L (ref 10–40)
Albumin: 5 g/dL (ref 3.6–5.1)
Alkaline phosphatase (APISO): 67 U/L (ref 36–130)
BUN: 13 mg/dL (ref 7–25)
CO2: 26 mmol/L (ref 20–32)
Calcium: 9.9 mg/dL (ref 8.6–10.3)
Chloride: 102 mmol/L (ref 98–110)
Creat: 1.18 mg/dL (ref 0.60–1.35)
Globulin: 2.3 g/dL (calc) (ref 1.9–3.7)
Glucose, Bld: 94 mg/dL (ref 65–99)
Potassium: 4.2 mmol/L (ref 3.5–5.3)
Sodium: 139 mmol/L (ref 135–146)
Total Bilirubin: 0.5 mg/dL (ref 0.2–1.2)
Total Protein: 7.3 g/dL (ref 6.1–8.1)

## 2019-10-06 LAB — HIV-1 RNA QUANT-NO REFLEX-BLD
HIV 1 RNA Quant: <20 copies/mL
HIV-1 RNA Quant, Log: <1.3 Log copies/mL

## 2019-10-12 ENCOUNTER — Other Ambulatory Visit: Payer: Self-pay | Admitting: Family

## 2019-10-12 DIAGNOSIS — Z720 Tobacco use: Secondary | ICD-10-CM

## 2019-10-17 ENCOUNTER — Encounter: Payer: 59 | Admitting: Family

## 2019-10-18 ENCOUNTER — Ambulatory Visit: Payer: 59 | Admitting: Family

## 2019-11-05 ENCOUNTER — Ambulatory Visit (INDEPENDENT_AMBULATORY_CARE_PROVIDER_SITE_OTHER): Payer: 59 | Admitting: Family

## 2019-11-05 ENCOUNTER — Encounter: Payer: Self-pay | Admitting: Family

## 2019-11-05 ENCOUNTER — Other Ambulatory Visit: Payer: Self-pay

## 2019-11-05 VITALS — BP 131/85 | HR 91 | Temp 97.6°F | Ht 70.0 in | Wt 187.0 lb

## 2019-11-05 DIAGNOSIS — Z0000 Encounter for general adult medical examination without abnormal findings: Secondary | ICD-10-CM | POA: Diagnosis not present

## 2019-11-05 DIAGNOSIS — B20 Human immunodeficiency virus [HIV] disease: Secondary | ICD-10-CM | POA: Diagnosis not present

## 2019-11-05 DIAGNOSIS — Z720 Tobacco use: Secondary | ICD-10-CM

## 2019-11-05 MED ORDER — TRIUMEQ 600-50-300 MG PO TABS: 1.0000 | ORAL_TABLET | Freq: Every day | ORAL | 5 refills | Status: DC

## 2019-11-05 NOTE — Assessment & Plan Note (Signed)
Currently using Chantix to assist with quitting smoking and recently smoked 2 cigarettes in the last 48 hours.  Has been tobacco free prior to that for up to 3 weeks.  Continue current dose of Chantix.  Encourage use of tobacco cessation hotline as needed.  Continue to monitor.

## 2019-11-05 NOTE — Progress Notes (Signed)
Subjective:    Patient ID: Lawrence Lozano, male    DOB: 04-23-1971, 49 y.o.   MRN: [id number]  Chief Complaint  Patient presents with  . Follow-up     HPI:  Lawrence Lozano is a 49 y.o. male with HIV disease was last seen in the office on 06/19/2019 with well-controlled HIV disease and good adherence and tolerance to his ART regimen of Triumeq.  Blood work at the time showed a CD4 count of 1659 and a viral load that was undetectable.  Most recent blood work completed on 10/03/2019 with a CD4 count of 1567 and a viral load that is undetectable.  Mr. Emel continues to take his Triumeq as prescribed with no adverse side effects or missed doses.  Overall feeling well today with no new concerns/complaints.  He is currently taking Chantix to help with quitting smoking which is not covered by insurance.  He did smoke 2 cigarettes yesterday but overall has been tobacco free for up to 3 weeks prior to that point. Denies fevers, chills, night sweats, headaches, changes in vision, neck pain/stiffness, nausea, diarrhea, vomiting, lesions or rashes.  Mr. Larue has no problems obtaining his medication from the pharmacy and remains covered through [place] [employer].  Denies feelings of being down, depressed, or hopeless recently.  No recreational or illicit drug use or alcohol consumption.  Continues to work on tobacco cessation.  Not currently sexually active at present.  No Known Allergies   Outpatient Medications Prior to Visit  Medication Sig Dispense Refill  . varenicline (CHANTIX CONTINUING MONTH PAK) 1 MG tablet Take 1 tablet (1 mg total) by mouth 2 (two) times daily. 60 tablet 2  . abacavir-dolutegravir-lamiVUDine (TRIUMEQ) 600-50-300 MG tablet Take 1 tablet by mouth daily. 30 tablet 5  . varenicline (CHANTIX STARTING MONTH PAK) 0.5 MG X 11 & 1 MG X 42 tablet TAKE AS DIRECTED 53 each 0   No facility-administered medications prior to visit.     Past Medical History:  Diagnosis Date  .  HIV infection (HCC)   . Hyperlipidemia   . Low testosterone   . THRUSH 11/22/2007   Qualifier: Diagnosis of  By: Ileene Musa       Past Surgical History:  Procedure Laterality Date  . APPENDECTOMY       Review of Systems  Constitutional: Negative for appetite change, chills, fatigue, fever and unexpected weight change.  Eyes: Negative for visual disturbance.  Respiratory: Negative for cough, chest tightness, shortness of breath and wheezing.   Cardiovascular: Negative for chest pain and leg swelling.  Gastrointestinal: Negative for abdominal pain, constipation, diarrhea, nausea and vomiting.  Genitourinary: Negative for dysuria, flank pain, frequency, genital sores, hematuria and urgency.  Skin: Negative for rash.  Allergic/Immunologic: Negative for immunocompromised state.  Neurological: Negative for dizziness and headaches.      Objective:    BP 131/85   Pulse 91   Temp 97.6 F (36.4 C)   Ht 5\' 10"  (1.778 m)   Wt 187 lb (84.8 kg)   SpO2 96%   BMI 26.83 kg/m  Nursing note and vital signs reviewed.  Physical Exam Constitutional:      General: He is not in acute distress.    Appearance: He is well-developed.  Eyes:     Conjunctiva/sclera: Conjunctivae normal.  Cardiovascular:     Rate and Rhythm: Normal rate and regular rhythm.     Heart sounds: Normal heart sounds. No murmur. No friction rub. No gallop.   Pulmonary:  Effort: Pulmonary effort is normal. No respiratory distress.     Breath sounds: Normal breath sounds. No wheezing or rales.  Chest:     Chest wall: No tenderness.  Abdominal:     General: Bowel sounds are normal.     Palpations: Abdomen is soft.     Tenderness: There is no abdominal tenderness.  Musculoskeletal:     Cervical back: Neck supple.  Lymphadenopathy:     Cervical: No cervical adenopathy.  Skin:    General: Skin is warm and dry.     Findings: No rash.  Neurological:     Mental Status: He is alert and oriented to person,  place, and time.  Psychiatric:        Behavior: Behavior normal.        Thought Content: Thought content normal.        Judgment: Judgment normal.     Depression screen [facility] 2/9 06/19/2019 03/26/2019  Decreased Interest 0 0  Down, Depressed, Hopeless 0 0  PHQ - 2 Score 0 0       Assessment & Plan:    Patient Active Problem List   Diagnosis Date Noted  . Allergic rhinitis 06/19/2019  . Skin tag 06/19/2019  . Tobacco use 06/19/2019  . Healthcare maintenance 03/26/2019  . Mixed hyperlipidemia 03/26/2019  . Human immunodeficiency virus (HIV) disease ([place]) 10/31/2007     Problem List Items Addressed This Visit      Other   Human immunodeficiency virus (HIV) disease ([place])    Mr. Peretti continues to have well-controlled HIV disease with good adherence and tolerance to his ART regimen of Triumeq.  No signs/symptoms of opportunistic infection or progressive HIV disease.  We reviewed his lab work and discussed the plan of care.  Continue current dose of Triumeq.  Introduced long-acting injectable medications which she is considering.  Plan for follow-up in 4 months or sooner if needed with lab work 1 to 2 weeks prior to appointment.      Relevant Medications   abacavir-dolutegravir-lamiVUDine (TRIUMEQ) 600-50-300 MG tablet   Other Relevant Orders   T-helper cell (CD4)- ([facility] clinic only)   HIV-1 RNA quant-no reflex-bld   Comprehensive metabolic panel   Healthcare maintenance     Discussed importance of safe sexual practice to reduce risk of STI.      Tobacco use    Currently using Chantix to assist with quitting smoking and recently smoked 2 cigarettes in the last 48 hours.  Has been tobacco free prior to that for up to 3 weeks.  Continue current dose of Chantix.  Encourage use of tobacco cessation hotline as needed.  Continue to monitor.          I am having Darlin Coco. Arakelian maintain his varenicline, Chantix Starting [employer], and Triumeq.   Meds ordered this encounter    Medications  . abacavir-dolutegravir-lamiVUDine (TRIUMEQ) 600-50-300 MG tablet    Sig: Take 1 tablet by mouth daily.    Dispense:  30 tablet    Refill:  5    Order Specific Question:   Supervising Provider    Answer:   Carlyle Basques [[id number]]     Follow-up: Return in about 4 months (around 03/06/2020), or if symptoms worsen or fail to improve.   Terri Piedra, MSN, FNP-C Nurse Practitioner [facility] for Infectious Disease [place] number: [phone]

## 2019-11-05 NOTE — Patient Instructions (Signed)
Nice to see you.  Please continue take your Triumeq as prescribed daily.   Refills will be sent to the pharmacy.   Look into [place] which is the new monthly injectable medication.  Plan for follow up in 4 months or sooner if needed with lab work 1-2 weeks prior to appointment.   Have a great day and stay safe!

## 2019-11-05 NOTE — Assessment & Plan Note (Signed)
   Discussed importance of safe sexual practice to reduce risk of STI. 

## 2019-11-05 NOTE — Assessment & Plan Note (Signed)
Lawrence Lozano continues to have well-controlled HIV disease with good adherence and tolerance to his ART regimen of Triumeq.  No signs/symptoms of opportunistic infection or progressive HIV disease.  We reviewed his lab work and discussed the plan of care.  Continue current dose of Triumeq.  Introduced long-acting injectable medications which she is considering.  Plan for follow-up in 4 months or sooner if needed with lab work 1 to 2 weeks prior to appointment.

## 2020-02-21 ENCOUNTER — Other Ambulatory Visit: Payer: 59

## 2020-02-21 ENCOUNTER — Other Ambulatory Visit: Payer: Self-pay

## 2020-02-21 DIAGNOSIS — B20 Human immunodeficiency virus [HIV] disease: Secondary | ICD-10-CM

## 2020-02-22 LAB — T-HELPER CELL (CD4) - (RCID CLINIC ONLY)
CD4 % Helper T Cell: 32 % — ABNORMAL LOW (ref 33–65)
CD4 T Cell Abs: 726 /uL (ref 400–1790)

## 2020-02-24 LAB — COMPREHENSIVE METABOLIC PANEL
AG Ratio: 1.4 (calc) (ref 1.0–2.5)
ALT: 32 U/L (ref 9–46)
AST: 18 U/L (ref 10–40)
Albumin: 4.7 g/dL (ref 3.6–5.1)
Alkaline phosphatase (APISO): 72 U/L (ref 36–130)
BUN: 19 mg/dL (ref 7–25)
CO2: 27 mmol/L (ref 20–32)
Calcium: 10.2 mg/dL (ref 8.6–10.3)
Chloride: 102 mmol/L (ref 98–110)
Creat: 1.03 mg/dL (ref 0.60–1.35)
Globulin: 3.4 g/dL (calc) (ref 1.9–3.7)
Glucose, Bld: 110 mg/dL — ABNORMAL HIGH (ref 65–99)
Potassium: 4.7 mmol/L (ref 3.5–5.3)
Sodium: 136 mmol/L (ref 135–146)
Total Bilirubin: 0.6 mg/dL (ref 0.2–1.2)
Total Protein: 8.1 g/dL (ref 6.1–8.1)

## 2020-02-24 LAB — HIV-1 RNA QUANT-NO REFLEX-BLD
HIV 1 RNA Quant: 63100 copies/mL — ABNORMAL HIGH
HIV-1 RNA Quant, Log: 4.8 Log copies/mL — ABNORMAL HIGH

## 2020-03-06 ENCOUNTER — Ambulatory Visit (INDEPENDENT_AMBULATORY_CARE_PROVIDER_SITE_OTHER): Payer: 59 | Admitting: Family

## 2020-03-06 ENCOUNTER — Other Ambulatory Visit: Payer: Self-pay

## 2020-03-06 ENCOUNTER — Encounter: Payer: Self-pay | Admitting: Family

## 2020-03-06 ENCOUNTER — Telehealth: Payer: Self-pay | Admitting: Pharmacist

## 2020-03-06 VITALS — BP 134/84 | HR 84 | Temp 98.4°F | Wt 178.0 lb

## 2020-03-06 DIAGNOSIS — B20 Human immunodeficiency virus [HIV] disease: Secondary | ICD-10-CM | POA: Diagnosis not present

## 2020-03-06 DIAGNOSIS — Z720 Tobacco use: Secondary | ICD-10-CM

## 2020-03-06 DIAGNOSIS — Z0000 Encounter for general adult medical examination without abnormal findings: Secondary | ICD-10-CM | POA: Diagnosis not present

## 2020-03-06 MED ORDER — BUPROPION HCL ER (SMOKING DET) 150 MG PO TB12: ORAL_TABLET | ORAL | 2 refills | Status: DC

## 2020-03-06 NOTE — Assessment & Plan Note (Signed)
Lawrence Lozano has poorly controlled HIV disease secondary to less than optimal adherence to his ART regimen of Triumeq.  Fortunately he has no signs/symptoms of opportunistic infection or progressive HIV disease.  Discussed importance of taking medication daily as prescribed.  He is interested in transitioning to the injectable medication.  Discussed it would be best for him to be undetectable prior to that occurring.  Met with pharmacy and filled out paperwork today.  In the meantime, continue current dose of Triumeq.  Plan for follow-up in 1 month or sooner if needed.

## 2020-03-06 NOTE — Progress Notes (Signed)
Subjective:    Patient ID: Lawrence Lozano, male    DOB: Oct 30, 1970, 49 y.o.   MRN: [id number]  Chief Complaint  Patient presents with  . Follow-up    B20. Patient has not had Triumeq in 6 months. Patient states his numbers have been good, so he stopped taking the medication. Patient also would like his Chantix replaced since it is no longer on the market.      HPI:  Lawrence Lozano is a 49 y.o. male with HIV disease who was last seen in the office on 11/05/2019 with good adherence and tolerance to his ART regimen of Triumeq.  Viral load at the time was undetectable and CD4 count was 1567.  Most recent blood work completed on 02/21/2020 with viral load of 63,100 and a CD4 count of 726.  Here today for routine follow-up.  Lawrence Lozano has not taken his Triumeq since his last office visit indicating that his numbers were good.  Overall feeling okay today and has several concerns about his weight.  Currently on phentermine and B12 injections. Denies fevers, chills, night sweats, headaches, changes in vision, neck pain/stiffness, nausea, diarrhea, vomiting, lesions or rashes.   Lawrence Lozano has no problems obtaining his medication from the pharmacy and remains covered through [place] [employer].  He has several bottles of Triumeq at home currently.  Denies feelings of being down, depressed, or hopeless.  No current recreational or illicit drug use or alcohol consumption.  He does continue to smoke tobacco daily.  Has previously been on Chantix which has since been withdrawn from the market and would like to try other medications to assist with tobacco cessation. Interested in trying the injectable medications.    No Known Allergies    Outpatient Medications Prior to Visit  Medication Sig Dispense Refill  . abacavir-dolutegravir-lamiVUDine (TRIUMEQ) 600-50-300 MG tablet Take 1 tablet by mouth daily. (Patient not taking: Reported on 03/06/2020) 30 tablet 5  . varenicline (CHANTIX CONTINUING MONTH  PAK) 1 MG tablet Take 1 tablet (1 mg total) by mouth 2 (two) times daily. (Patient not taking: Reported on 03/06/2020) 60 tablet 2  . varenicline (CHANTIX STARTING MONTH PAK) 0.5 MG X 11 & 1 MG X 42 tablet TAKE AS DIRECTED (Patient not taking: Reported on 03/06/2020) 53 each 0   No facility-administered medications prior to visit.     Past Medical History:  Diagnosis Date  . HIV infection (HCC)   . Hyperlipidemia   . Low testosterone   . THRUSH 11/22/2007   Qualifier: Diagnosis of  By: Ileene Musa       Past Surgical History:  Procedure Laterality Date  . APPENDECTOMY         Review of Systems  Constitutional: Negative for appetite change, chills, fatigue, fever and unexpected weight change.  Eyes: Negative for visual disturbance.  Respiratory: Negative for cough, chest tightness, shortness of breath and wheezing.   Cardiovascular: Negative for chest pain and leg swelling.  Gastrointestinal: Negative for abdominal pain, constipation, diarrhea, nausea and vomiting.  Genitourinary: Negative for dysuria, flank pain, frequency, genital sores, hematuria and urgency.  Skin: Negative for rash.  Allergic/Immunologic: Negative for immunocompromised state.  Neurological: Negative for dizziness and headaches.      Objective:    BP 134/84   Pulse 84   Temp 98.4 F (36.9 C) (Oral)   Wt 178 lb (80.7 kg)   BMI 25.54 kg/m  Nursing note and vital signs reviewed.  Physical Exam Constitutional:  General: He is not in acute distress.    Appearance: He is well-developed.  Eyes:     Conjunctiva/sclera: Conjunctivae normal.  Cardiovascular:     Rate and Rhythm: Normal rate and regular rhythm.     Heart sounds: Normal heart sounds. No murmur heard.  No friction rub. No gallop.   Pulmonary:     Effort: Pulmonary effort is normal. No respiratory distress.     Breath sounds: Normal breath sounds. No wheezing or rales.  Chest:     Chest wall: No tenderness.  Abdominal:      General: Bowel sounds are normal.     Palpations: Abdomen is soft.     Tenderness: There is no abdominal tenderness.  Musculoskeletal:     Cervical back: Neck supple.  Lymphadenopathy:     Cervical: No cervical adenopathy.  Skin:    General: Skin is warm and dry.     Findings: No rash.  Neurological:     Mental Status: He is alert and oriented to person, place, and time.  Psychiatric:        Behavior: Behavior normal.        Thought Content: Thought content normal.        Judgment: Judgment normal.      Depression screen [facility] 2/9 03/06/2020 06/19/2019 03/26/2019  Decreased Interest 0 0 0  Down, Depressed, Hopeless 0 0 0  PHQ - 2 Score 0 0 0       Assessment & Plan:    Patient Active Problem List   Diagnosis Date Noted  . Allergic rhinitis 06/19/2019  . Skin tag 06/19/2019  . Tobacco use 06/19/2019  . Healthcare maintenance 03/26/2019  . Mixed hyperlipidemia 03/26/2019  . Human immunodeficiency virus (HIV) disease (HCC) 10/31/2007     Problem List Items Addressed This Visit    None       I am having Lawrence Lozano maintain his varenicline, Chantix Starting [employer], and Triumeq.   No orders of the defined types were placed in this encounter.    Follow-up: No follow-ups on file.   Marcos Eke, MSN, FNP-C Nurse Practitioner [facility] for Infectious Disease [facility] Main number: [phone]

## 2020-03-06 NOTE — Telephone Encounter (Signed)
Faxed patient's [place] enrollment form to [employer] today. They will assess patient's insurance and send a benefits investigation form detailing how to start the injections (where to get it from and cost). Once benefits investigation is complete, we will order oral lead-in therapy and start patient on treatment. Will update encounter with further details when available. 

## 2020-03-06 NOTE — Assessment & Plan Note (Signed)
   Working on weight loss and has lost about 15 pounds.  Discussed importance of safe sexual practice to reduce risk of STI.

## 2020-03-06 NOTE — Patient Instructions (Signed)
Nice to see you.  Restart taking your Triumeq until we get approval for [place].  We will send a prescription for Zyban to the pharmacy.  Plan for follow up in 6 weeks or sooner if needed with lab work 1-2 weeks prior to appointment or on same day.  Have a great day and stay safe!

## 2020-03-06 NOTE — Assessment & Plan Note (Addendum)
Previously using Chantix which has since been removed from the market. Will change to Zyban. Continue to monitor.

## 2020-03-31 ENCOUNTER — Other Ambulatory Visit: Payer: Self-pay | Admitting: Family

## 2020-04-04 ENCOUNTER — Other Ambulatory Visit: Payer: Self-pay

## 2020-04-04 DIAGNOSIS — B20 Human immunodeficiency virus [HIV] disease: Secondary | ICD-10-CM

## 2020-04-10 ENCOUNTER — Other Ambulatory Visit: Payer: Self-pay

## 2020-04-10 ENCOUNTER — Other Ambulatory Visit: Payer: 59

## 2020-04-10 DIAGNOSIS — B20 Human immunodeficiency virus [HIV] disease: Secondary | ICD-10-CM

## 2020-04-11 LAB — T-HELPER CELL (CD4) - (RCID CLINIC ONLY)
CD4 % Helper T Cell: 36 % (ref 33–65)
CD4 T Cell Abs: 939 /uL (ref 400–1790)

## 2020-04-13 LAB — COMPLETE METABOLIC PANEL WITH GFR
AG Ratio: 1.5 (calc) (ref 1.0–2.5)
ALT: 20 U/L (ref 9–46)
AST: 13 U/L (ref 10–40)
Albumin: 4.5 g/dL (ref 3.6–5.1)
Alkaline phosphatase (APISO): 71 U/L (ref 36–130)
BUN: 20 mg/dL (ref 7–25)
CO2: 23 mmol/L (ref 20–32)
Calcium: 9.5 mg/dL (ref 8.6–10.3)
Chloride: 103 mmol/L (ref 98–110)
Creat: 1.3 mg/dL (ref 0.60–1.35)
GFR, Est African American: 75 mL/min/{1.73_m2} (ref >=60)
GFR, Est Non African American: 65 mL/min/{1.73_m2} (ref >=60)
Globulin: 3.1 g/dL (calc) (ref 1.9–3.7)
Glucose, Bld: 88 mg/dL (ref 65–99)
Potassium: 4.5 mmol/L (ref 3.5–5.3)
Sodium: 135 mmol/L (ref 135–146)
Total Bilirubin: 0.3 mg/dL (ref 0.2–1.2)
Total Protein: 7.6 g/dL (ref 6.1–8.1)

## 2020-04-13 LAB — HIV-1 RNA QUANT-NO REFLEX-BLD
HIV 1 RNA Quant: <20 Copies/mL — ABNORMAL HIGH
HIV-1 RNA Quant, Log: <1.3 Log cps/mL — ABNORMAL HIGH

## 2020-04-23 ENCOUNTER — Telehealth: Payer: Self-pay

## 2020-04-23 NOTE — Telephone Encounter (Signed)
COVID-19 Pre-Screening Questions:04/23/20  Do you currently have a fever (>100 F), chills or unexplained body aches? NO  Are you currently experiencing new cough, shortness of breath, sore throat, runny nose? NO .  Have you been in contact with someone that is currently pending confirmation of Covid19 testing or has been confirmed to have the Covid19 virus?NO  **If the patient answers NO to ALL questions -  advise the patient to please call the clinic before coming to the office should any symptoms develop.     

## 2020-04-24 ENCOUNTER — Other Ambulatory Visit: Payer: Self-pay

## 2020-04-24 ENCOUNTER — Ambulatory Visit (INDEPENDENT_AMBULATORY_CARE_PROVIDER_SITE_OTHER): Payer: 59 | Admitting: Family

## 2020-04-24 ENCOUNTER — Encounter: Payer: Self-pay | Admitting: Family

## 2020-04-24 VITALS — BP 123/79 | HR 82 | Wt 173.0 lb

## 2020-04-24 DIAGNOSIS — B20 Human immunodeficiency virus [HIV] disease: Secondary | ICD-10-CM

## 2020-04-24 DIAGNOSIS — Z0000 Encounter for general adult medical examination without abnormal findings: Secondary | ICD-10-CM | POA: Diagnosis not present

## 2020-04-24 MED ORDER — TRIUMEQ 600-50-300 MG PO TABS
1.0000 | ORAL_TABLET | Freq: Every day | ORAL | 5 refills | Status: AC
Start: 2020-04-24 — End: ?

## 2020-04-24 NOTE — Assessment & Plan Note (Signed)
Lawrence Lozano once again has well-controlled HIV disease with good adherence and tolerance to his ART regimen of Triumeq.  No signs/symptoms of opportunistic infection or progressive HIV disease.  Reviewed lab work and discussed plan of care.  We will begin oral lead-in therapy for injectable medications once available.  Pharmacy will be in contact in the next few days regarding obtaining medication.  In the meantime, continue current dose of Triumeq.  Plan for follow-up 1 month after starting lead in therapy.

## 2020-04-24 NOTE — Assessment & Plan Note (Signed)
·   Discussed importance of safe sexual practice to reduce risk of STI.  Condoms declined.  Routine dental care up-to-date every 6 months.  Discussed/recommended Covid vaccine which was declined.

## 2020-04-24 NOTE — Patient Instructions (Addendum)
Nice to see you.   Lawrence Lozano will call Friday or Monday about lead in medications.  Continue Triumeq until lead in meds are available.  Refills have been sent tot he pharmacy.   Check on your Copay for [place] with your insurance company.   Plan for follow up 1 month after starting new medication.  Have a great day and stay safe!

## 2020-04-24 NOTE — Progress Notes (Signed)
Subjective:    Patient ID: Lawrence Lozano, male    DOB: [birth year], 49 y.o.   MRN: [id number]  Chief Complaint  Patient presents with  . HIV Positive/AIDS     HPI:  Lawrence Lozano is a 49 y.o. male with HIV disease with risk factors for HIV including MSM who was last seen in the office on 03/06/2020 with poorly controlled HIV disease secondary to less than optimal adherence to his regimen of Triumeq.  Viral load at the time was found to be 63,100 with CD4 count of 726.  Most recent blood work completed on 04/10/2020 with a viral load that was undetectable and CD4 count of 939.  Kidney function, liver function, and electrolytes within normal ranges.  Here today for routine follow-up.   Lawrence Lozano has been taking his Triumeq as prescribed daily with no adverse side effects or missed doses since his last office visit.  Overall feeling well today with no new concerns/complaints. Denies fevers, chills, night sweats, headaches, changes in vision, neck pain/stiffness, nausea, diarrhea, vomiting, lesions or rashes.  Lawrence Lozano has no problems obtaining his medication from the pharmacy and remains covered through [place] [employer].  Denies feelings of being down, depressed, or hopeless recently.  Declines Covid vaccine.  Routine dental care is up-to-date every 6 months.  Eager to get started on transition to injectable medications.    No Known Allergies    Outpatient Medications Prior to Visit  Medication Sig Dispense Refill  . abacavir-dolutegravir-lamiVUDine (TRIUMEQ) 600-50-300 MG tablet Take 1 tablet by mouth daily. 30 tablet 5  . buPROPion (WELLBUTRIN SR) 150 MG 12 hr tablet TAKE 1 TABLET BY MOUTH DAILY FOR 3 DAYS THEN 1 TABLET BY MOUTH TWICE DAILY. (Patient not taking: Reported on 04/24/2020) 180 tablet 1   No facility-administered medications prior to visit.     Past Medical History:  Diagnosis Date  . HIV infection (HCC)   . Hyperlipidemia   . Low testosterone   . THRUSH  11/22/2007   Qualifier: Diagnosis of  By: Ileene Musa       Past Surgical History:  Procedure Laterality Date  . APPENDECTOMY         Review of Systems  Constitutional: Negative for appetite change, chills, fatigue, fever and unexpected weight change.  Eyes: Negative for visual disturbance.  Respiratory: Negative for cough, chest tightness, shortness of breath and wheezing.   Cardiovascular: Negative for chest pain and leg swelling.  Gastrointestinal: Negative for abdominal pain, constipation, diarrhea, nausea and vomiting.  Genitourinary: Negative for dysuria, flank pain, frequency, genital sores, hematuria and urgency.  Skin: Negative for rash.  Allergic/Immunologic: Negative for immunocompromised state.  Neurological: Negative for dizziness and headaches.      Objective:    BP 123/79   Pulse 82   Wt 173 lb (78.5 kg)   SpO2 97%   BMI 24.82 kg/m  Nursing note and vital signs reviewed.  Physical Exam Constitutional:      General: He is not in acute distress.    Appearance: He is well-developed.  Eyes:     Conjunctiva/sclera: Conjunctivae normal.  Cardiovascular:     Rate and Rhythm: Normal rate and regular rhythm.     Heart sounds: Normal heart sounds. No murmur heard.  No friction rub. No gallop.   Pulmonary:     Effort: Pulmonary effort is normal. No respiratory distress.     Breath sounds: Normal breath sounds. No wheezing or rales.  Chest:  Chest wall: No tenderness.  Abdominal:     General: Bowel sounds are normal.     Palpations: Abdomen is soft.     Tenderness: There is no abdominal tenderness.  Musculoskeletal:     Cervical back: Neck supple.  Lymphadenopathy:     Cervical: No cervical adenopathy.  Skin:    General: Skin is warm and dry.     Findings: No rash.  Neurological:     Mental Status: He is alert and oriented to person, place, and time.  Psychiatric:        Behavior: Behavior normal.        Thought Content: Thought content  normal.        Judgment: Judgment normal.      Depression screen [facility] 2/9 04/24/2020 03/06/2020 06/19/2019 03/26/2019  Decreased Interest 0 0 0 0  Down, Depressed, Hopeless 0 0 0 0  PHQ - 2 Score 0 0 0 0       Assessment & Plan:    Patient Active Problem List   Diagnosis Date Noted  . Allergic rhinitis 06/19/2019  . Skin tag 06/19/2019  . Tobacco use 06/19/2019  . Healthcare maintenance 03/26/2019  . Mixed hyperlipidemia 03/26/2019  . Human immunodeficiency virus (HIV) disease (HCC) 10/31/2007     Problem List Items Addressed This Visit      Other   Human immunodeficiency virus (HIV) disease (HCC)    Mr. Lawrence Lozano once again has well-controlled HIV disease with good adherence and tolerance to his ART regimen of Triumeq.  No signs/symptoms of opportunistic infection or progressive HIV disease.  Reviewed lab work and discussed plan of care.  We will begin oral lead-in therapy for injectable medications once available.  Pharmacy will be in contact in the next few days regarding obtaining medication.  In the meantime, continue current dose of Triumeq.  Plan for follow-up 1 month after starting lead in therapy.      Relevant Medications   abacavir-dolutegravir-lamiVUDine (TRIUMEQ) 600-50-300 MG tablet   Healthcare maintenance     Discussed importance of safe sexual practice to reduce risk of STI.  Condoms declined.  Routine dental care up-to-date every 6 months.  Discussed/recommended Covid vaccine which was declined.          I am having Lawrence Lozano maintain his buPROPion and Triumeq.   Meds ordered this encounter  Medications  . abacavir-dolutegravir-lamiVUDine (TRIUMEQ) 600-50-300 MG tablet    Sig: Take 1 tablet by mouth daily.    Dispense:  30 tablet    Refill:  5    Order Specific Question:   Supervising Provider    Answer:   Judyann Munson [[id number]]     Follow-up: Return in about 2 months (around 06/24/2020), or if symptoms worsen or fail to improve.   Lawrence Eke, MSN, FNP-C Nurse Practitioner [facility] for Infectious Disease [facility] Main number: [phone]

## 2020-04-30 ENCOUNTER — Telehealth: Payer: Self-pay

## 2020-04-30 NOTE — Telephone Encounter (Signed)
Received call from patient regarding HIV injectable and oral lead in medication. Patient has been expecting a call from pharmacy for update. Call transferred to [facility]. Lawrence Lozano

## 2020-08-07 ENCOUNTER — Telehealth: Payer: Self-pay

## 2020-08-07 NOTE — Telephone Encounter (Signed)
[facility] Patient Advocate Encounter   Received notification from [employer] that prior authorization for CABENUVA is required.   PA submitted on 08/07/20 Key [id number] Status is pending    [facility] will continue to follow.   Clearance Coots, CPhT Specialty Pharmacy Patient [facility] Phone: [phone] Fax:  [phone]

## 2020-08-22 ENCOUNTER — Telehealth: Payer: Self-pay

## 2020-08-22 NOTE — Telephone Encounter (Signed)
[facility] Patient Advocate Encounter  Received notification from [facility] that the request for prior authorization for CABENUVA has been denied due to patient need to try & fail 3 medications that is on the plan..       This encounter will continue to be updated until final determination.    Clearance Coots, CPhT Specialty Pharmacy Patient [facility] Phone: [phone] Fax:  [phone]

## 2020-08-22 NOTE — Telephone Encounter (Signed)
Barnet Pall - He has only been on [employer] so an appeal would not get this approved.

## 2020-08-25 NOTE — Telephone Encounter (Signed)
Got it!     Thanks =).

## 2021-07-02 ENCOUNTER — Telehealth: Payer: Self-pay

## 2021-07-02 NOTE — Telephone Encounter (Signed)
Patient last seen 04/2020, called to offer appointment and re-engage in care. Patient states he no longer lives in [place]. He has moved to [place], but has not yet established care there.   Relayed that we are happy to help by faxing his records to a clinic in [place] once he decides where he would like to be seen. Patient verbalized understanding and has no further questions.   Sandie Ano, RN

## 2022-12-08 ENCOUNTER — Telehealth: Payer: Self-pay

## 2022-12-08 NOTE — Telephone Encounter (Signed)
Received call today from Tina Griffiths with [facility] requesting immunization record for patient. States that he is transferring care to them and did not receive with his medical records. Will fax records to office today.  Mardene Celeste, [place] [facility] [address] [place], [place] [address] Phone [phone] Phone FAX [phone] Juanita Laster, RMA
